# Patient Record
Sex: Male | Born: 1970 | Race: White | Hispanic: No | Marital: Married | Smoking: Never smoker
Health system: Southern US, Community
[De-identification: ages and names within clinical notes are randomized; demographics above are authoritative.]

---

## 2001-10-20 ENCOUNTER — Encounter: Payer: Self-pay | Admitting: Emergency Medicine

## 2001-10-20 ENCOUNTER — Emergency Department (HOSPITAL_COMMUNITY): Admission: EM | Admit: 2001-10-20 | Discharge: 2001-10-21 | Payer: Self-pay | Admitting: Emergency Medicine

## 2004-08-07 ENCOUNTER — Ambulatory Visit (HOSPITAL_COMMUNITY): Admission: RE | Admit: 2004-08-07 | Discharge: 2004-08-07 | Payer: Self-pay | Admitting: Orthopedic Surgery

## 2006-02-19 ENCOUNTER — Ambulatory Visit: Payer: Self-pay | Admitting: Family Medicine

## 2006-02-26 ENCOUNTER — Ambulatory Visit: Payer: Self-pay | Admitting: Family Medicine

## 2006-03-30 IMAGING — CR DG ORBITS FOR FOREIGN BODY
2 series · 2 of 2 positions shown · non-contrast
Comparison: none

CLINICAL DATA: Pre MRI.  Question metallic foreign body.
 EYE FOR FOREIGN BODY:
 No radiopaque foreign body is identified.

[view not recorded (1 of 2)]
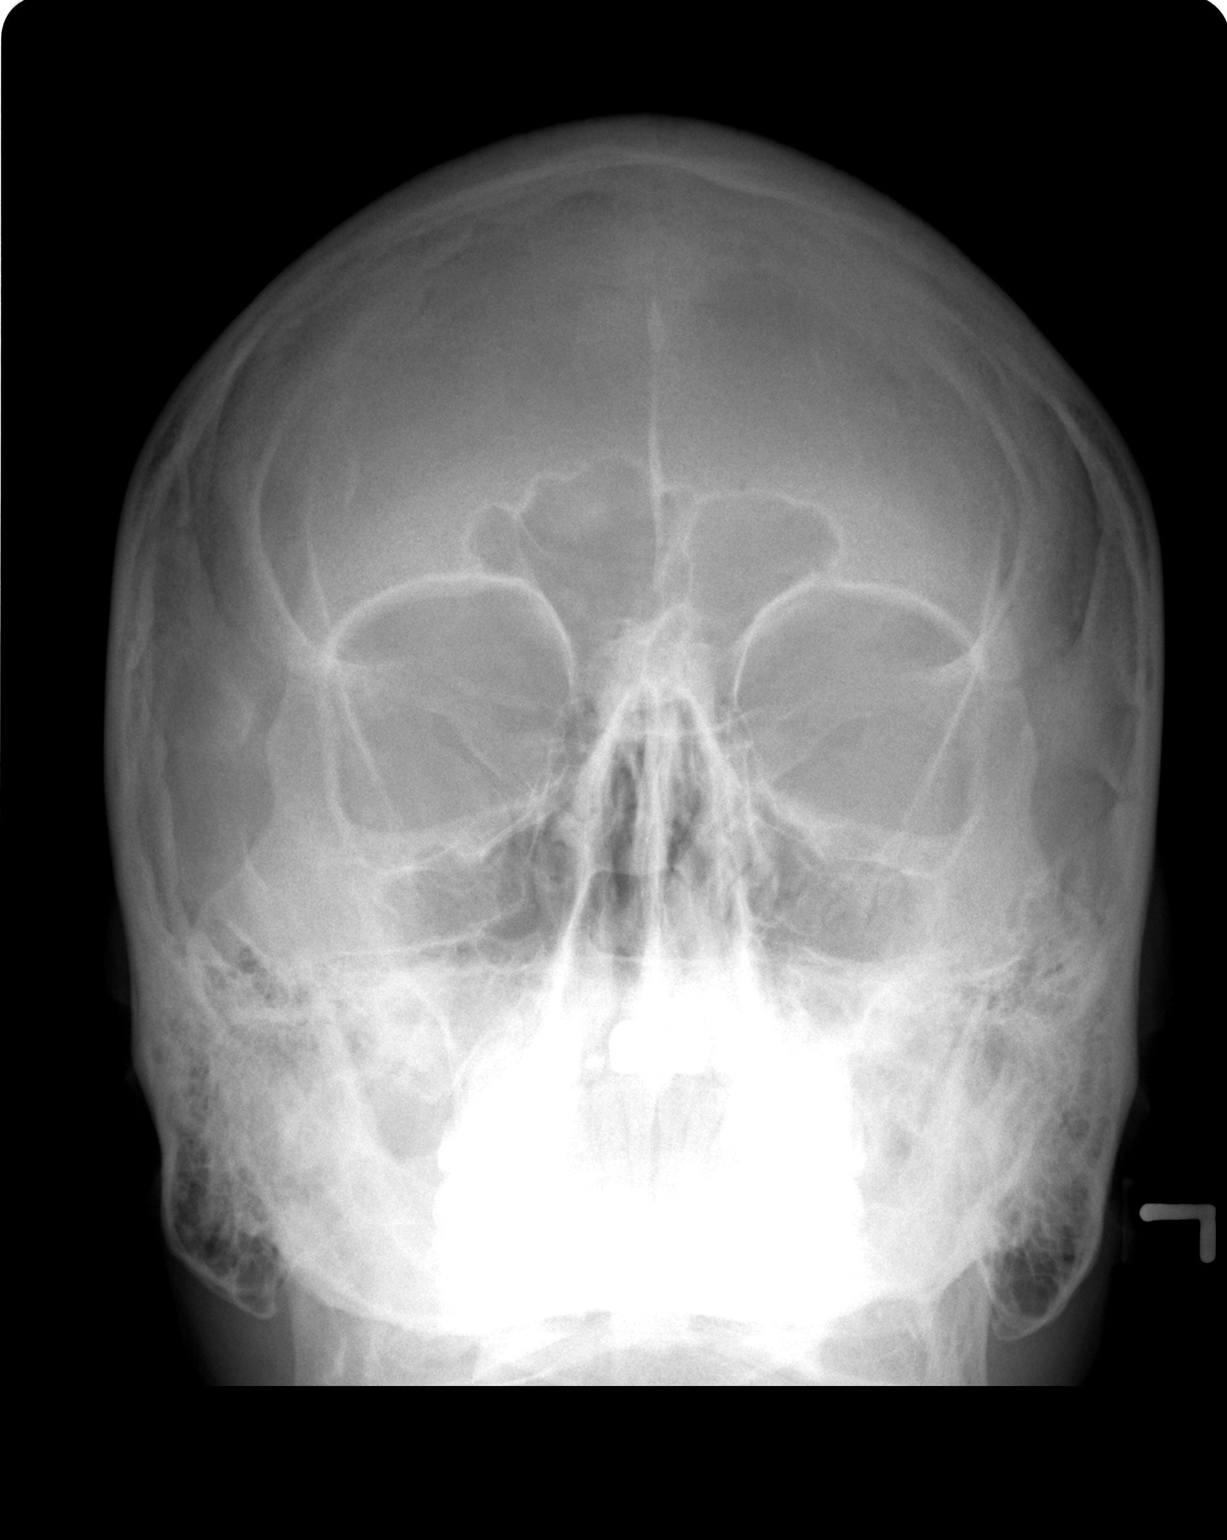

[view not recorded (2 of 2)]
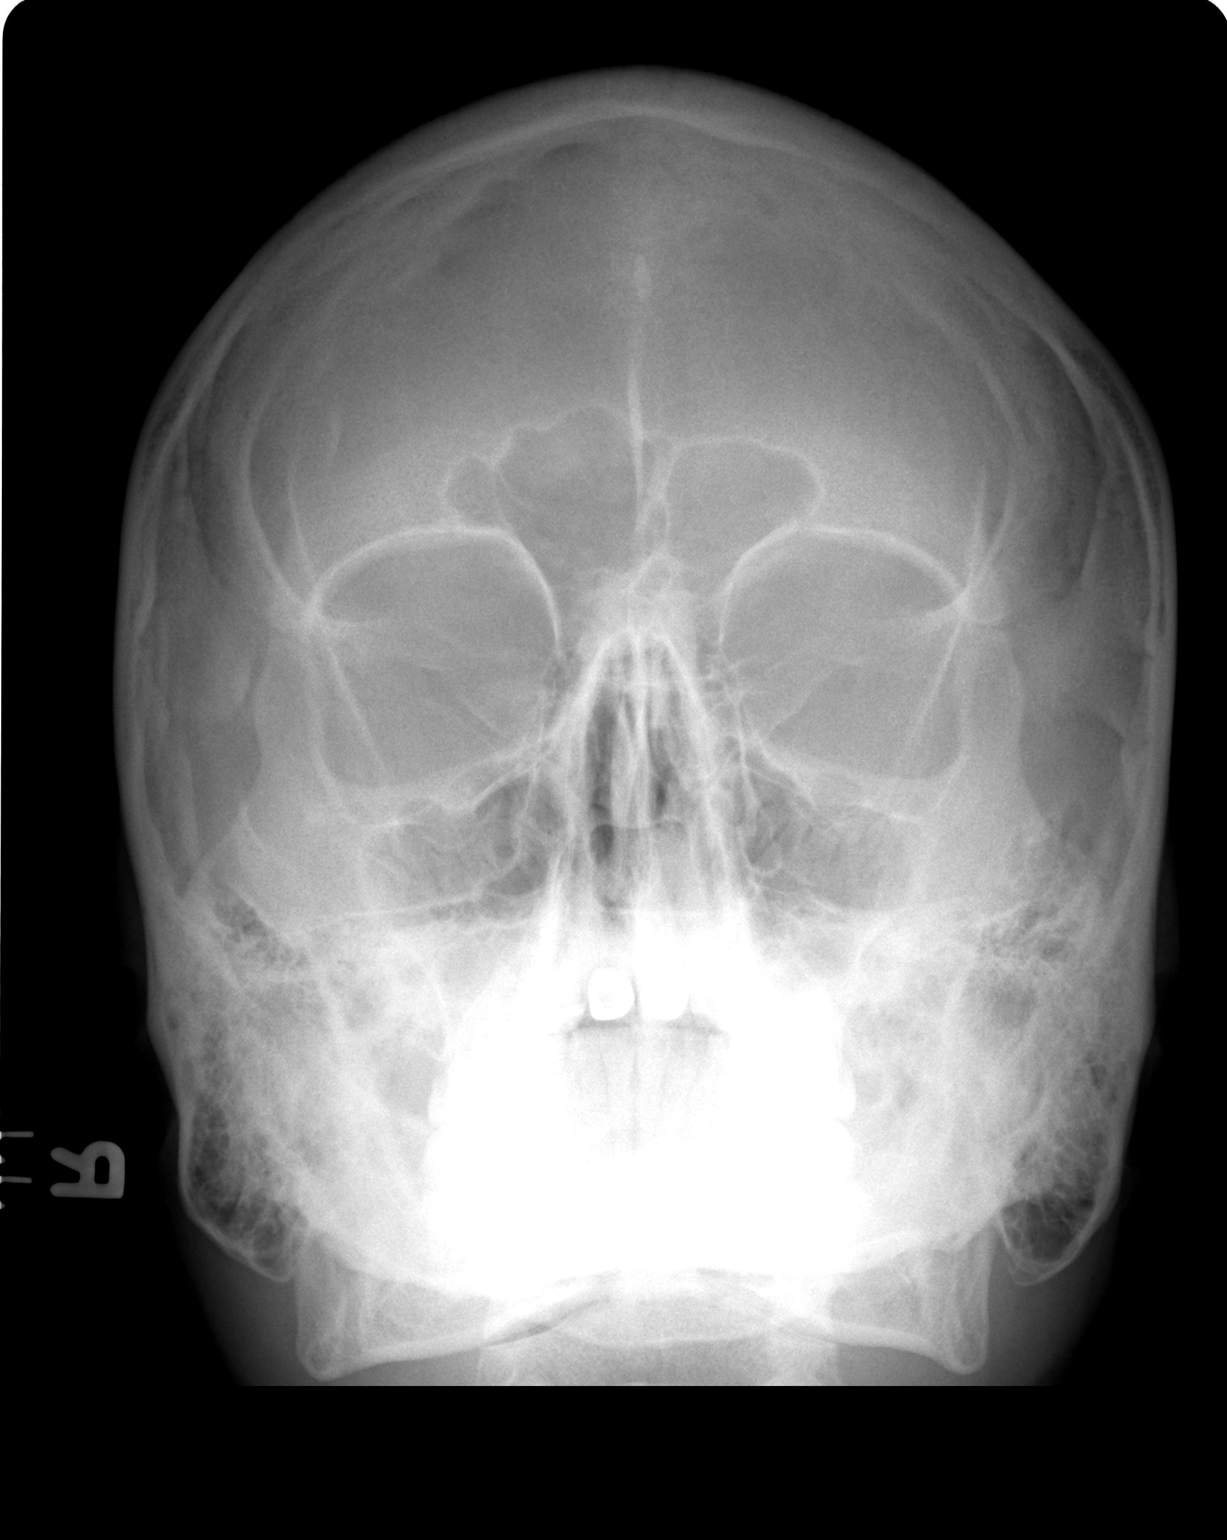

[2 of 2 positions shown; findings below may reference images not displayed]

IMPRESSION: Negative.  Patient may proceed to MRI.

## 2010-05-13 ENCOUNTER — Telehealth: Payer: Self-pay | Admitting: Family Medicine

## 2010-05-29 ENCOUNTER — Ambulatory Visit: Payer: Self-pay | Admitting: Family Medicine

## 2010-05-29 ENCOUNTER — Encounter: Payer: Self-pay | Admitting: Family Medicine

## 2010-05-29 DIAGNOSIS — M549 Dorsalgia, unspecified: Secondary | ICD-10-CM | POA: Insufficient documentation

## 2010-05-29 DIAGNOSIS — B369 Superficial mycosis, unspecified: Secondary | ICD-10-CM | POA: Insufficient documentation

## 2010-05-29 LAB — CONVERTED CEMR LAB
ALT: 38 units/L (ref 0–53)
AST: 26 units/L (ref 0–37)
Albumin: 4 g/dL (ref 3.5–5.2)
Alkaline Phosphatase: 53 units/L (ref 39–117)
BUN: 16 mg/dL (ref 6–23)
Basophils Absolute: 0 10*3/uL (ref 0.0–0.1)
Basophils Relative: 0.4 % (ref 0.0–3.0)
Bilirubin, Direct: 0.1 mg/dL (ref 0.0–0.3)
Blood in Urine, dipstick: NEGATIVE
CO2: 30 meq/L (ref 19–32)
Calcium: 9.3 mg/dL (ref 8.4–10.5)
Chloride: 102 meq/L (ref 96–112)
Cholesterol: 261 mg/dL — ABNORMAL HIGH (ref 0–200)
Creatinine, Ser: 1.2 mg/dL (ref 0.4–1.5)
Direct LDL: 180.9 mg/dL
Eosinophils Absolute: 0.1 10*3/uL (ref 0.0–0.7)
Eosinophils Relative: 1.7 % (ref 0.0–5.0)
GFR calc non Af Amer: 68.68 mL/min (ref >60)
Glucose, Bld: 86 mg/dL (ref 70–99)
Glucose, Urine, Semiquant: NEGATIVE
HCT: 47.4 % (ref 39.0–52.0)
HDL: 55.5 mg/dL (ref >39.00)
Hemoglobin: 16.2 g/dL (ref 13.0–17.0)
Ketones, urine, test strip: NEGATIVE
Lymphocytes Relative: 22.6 % (ref 12.0–46.0)
Lymphs Abs: 1.5 10*3/uL (ref 0.7–4.0)
MCHC: 34.2 g/dL (ref 30.0–36.0)
MCV: 94.5 fL (ref 78.0–100.0)
Monocytes Absolute: 0.5 10*3/uL (ref 0.1–1.0)
Monocytes Relative: 7.9 % (ref 3.0–12.0)
Neutro Abs: 4.6 10*3/uL (ref 1.4–7.7)
Neutrophils Relative %: 67.4 % (ref 43.0–77.0)
Nitrite: NEGATIVE
Platelets: 242 10*3/uL (ref 150.0–400.0)
Potassium: 4.5 meq/L (ref 3.5–5.1)
Protein, U semiquant: NEGATIVE
RBC: 5.02 M/uL (ref 4.22–5.81)
RDW: 13.6 % (ref 11.5–14.6)
Sodium: 140 meq/L (ref 135–145)
Specific Gravity, Urine: 1.025
TSH: 1.2 microintl units/mL (ref 0.35–5.50)
Total Bilirubin: 0.6 mg/dL (ref 0.3–1.2)
Total CHOL/HDL Ratio: 5
Total Protein: 6.8 g/dL (ref 6.0–8.3)
Triglycerides: 79 mg/dL (ref 0.0–149.0)
Urobilinogen, UA: 0.2
VLDL: 15.8 mg/dL (ref 0.0–40.0)
WBC Urine, dipstick: NEGATIVE
WBC: 6.8 10*3/uL (ref 4.5–10.5)
pH: 5.5

## 2010-06-18 ENCOUNTER — Telehealth: Payer: Self-pay | Admitting: *Deleted

## 2010-07-30 NOTE — Assessment & Plan Note (Signed)
Summary: CPX - OK PER DR TODD // RS   Vital Signs:  Patient profile:   40 year old male Height:      73 inches Weight:      208 pounds BMI:     27.54 Temp:     98.5 degrees F oral BP sitting:   140 / 98  (left arm) Cuff size:   regular  Vitals Entered By: Kern Reap CMA Duncan Dull) (May 29, 2010 8:05 AM)  Nutrition Counseling: Patient's BMI is greater than 25 and therefore counseled on weight management options. CC: cpx Is Patient Diabetic? No   CC:  cpx.  History of Present Illness: Mario Mcclure is a 40 year old, married man nonsmoker, who comes in today for general physical examination  Is always been in excellent health.  He said no chronic health problems.  He said numerous orthopedic injuries secondary to playing college football.  See details in previous note.  Most recently he said it back pain for the past 3+ weeks.  His worst episode was about 5 years ago, which required epidural steroid injections.  He now has pain in his right hip pain down to his right foot.  He describes as dull, a 3 on a scale of one to 10 fairly constant, however, it's getting better with stretching, and anti-inflammatories.  Is currently taking Lodine 400 b.i.d.  We have asked him to stop the prednisone, but he is also taking prednisone, one daily.  We discussed various options.  He is content to stay where he is right now to see if home therapy, etc., will not help.  He has no neurologic symptoms.  He has a rash on his right scrotum that won't heal.  Review of systems negative.  Last tetanus booster 2004.  Family history pertinent father had high cholesterol, hypertension, and a heart attack.  Preventive Screening-Counseling & Management  Alcohol-Tobacco     Smoking Status: never  Past History:  Past Medical History: concussion x 3  Past Surgical History: ACL both knees  Family History: Father: high cholesterol, HTN, MI Mother:  Siblings:   Social History: Married Never Smoked Smoking  Status:  never  Review of Systems      See HPI  Physical Exam  General:  Well-developed,well-nourished,in no acute distress; alert,appropriate and cooperative throughout examination Head:  Normocephalic and atraumatic without obvious abnormalities. No apparent alopecia or balding. Eyes:  No corneal or conjunctival inflammation noted. EOMI. Perrla. Funduscopic exam benign, without hemorrhages, exudates or papilledema. Vision grossly normal. Ears:  External ear exam shows no significant lesions or deformities.  Otoscopic examination reveals clear canals, tympanic membranes are intact bilaterally without bulging, retraction, inflammation or discharge. Hearing is grossly normal bilaterally. Nose:  External nasal examination shows no deformity or inflammation. Nasal mucosa are pink and moist without lesions or exudates. Mouth:  Oral mucosa and oropharynx without lesions or exudates.  Teeth in good repair. Neck:  No deformities, masses, or tenderness noted. Chest Wall:  No deformities, masses, tenderness or gynecomastia noted. Breasts:  No masses or gynecomastia noted Lungs:  Normal respiratory effort, chest expands symmetrically. Lungs are clear to auscultation, no crackles or wheezes. Heart:  Normal rate and regular rhythm. S1 and S2 normal without gallop, murmur, click, rub or other extra sounds. Abdomen:  Bowel sounds positive,abdomen soft and non-tender without masses, organomegaly or hernias noted. Rectal:  No external abnormalities noted. Normal sphincter tone. No rectal masses or tenderness. Genitalia:  Testes bilaterally descended without nodularity, tenderness or masses. No scrotal masses  or lesions. No penis lesions or urethral discharge................fungal infection, right scrotum            Prostate:  Prostate gland firm and smooth, no enlargement, nodularity, tenderness, mass, asymmetry or induration. Msk:  No deformity or scoliosis noted of thoracic or lumbar spine.     Pulses:  R and L carotid,radial,femoral,dorsalis pedis and posterior tibial pulses are full and equal bilaterally Extremities:  No clubbing, cyanosis, edema, or deformity noted with normal full range of motion of all joints.   Neurologic:  No cranial nerve deficits noted. Station and gait are normal. Plantar reflexes are down-going bilaterally. DTRs are symmetrical throughout. Sensory, motor and coordinative functions appear intact. Skin:  Intact without suspicious lesions or rashes Cervical Nodes:  No lymphadenopathy noted Axillary Nodes:  No palpable lymphadenopathy Inguinal Nodes:  No significant adenopathy Psych:  Cognition and judgment appear intact. Alert and cooperative with normal attention span and concentration. No apparent delusions, illusions, hallucinations   Problems:  Medical Problems Added: 1)  Dx of Routine General Medical Exam@health  Care Fac[facility]  (ICD-V70.0) 2)  Dx of Back Pain  (ICD-724.5) 3)  Dx of Fungal Dermatitis  (ICD-111.9)  Impression & Recommendations:  Problem # 1:  BACK PAIN (ICD-724.5) Assessment Deteriorated  His updated medication list for this problem includes:    Etodolac 400 Mg Tabs (Etodolac) .Marland Kitchen... Take one tab by mouth two times a day  Orders: Venipuncture (16109) Prescription Created Electronically [id number]) UA Dipstick w/o Micro (automated)  (81003) TLB-Lipid Panel (80061-LIPID) TLB-BMP (Basic Metabolic Panel-BMET) (80048-METABOL) TLB-CBC Platelet - w/Differential (85025-CBCD) TLB-Hepatic/Liver Function Pnl (80076-HEPATIC) TLB-TSH (Thyroid Stimulating Hormone) (84443-TSH)  Problem # 2:  FUNGAL DERMATITIS (ICD-111.9) Assessment: New  Orders: Venipuncture (09811) Prescription Created Electronically [phone]) UA Dipstick w/o Micro (automated)  (81003) TLB-Lipid Panel (80061-LIPID) TLB-BMP (Basic Metabolic Panel-BMET) (80048-METABOL) TLB-CBC Platelet - w/Differential (85025-CBCD) TLB-Hepatic/Liver Function Pnl (80076-HEPATIC) TLB-TSH  (Thyroid Stimulating Hormone) (84443-TSH)  His updated medication list for this problem includes:    Mentax 1 % Crea (Butenafine hcl) .Marland Kitchen... Apply two times a day  Problem # 3:  Preventive Health Care (ICD-V70.0) Assessment: Unchanged  Complete Medication List: 1)  Prednisone 20 Mg Tabs (Prednisone) .... Use as directed 2)  Etodolac 400 Mg Tabs (Etodolac) .... Take one tab by mouth two times a day 3)  Mentax 1 % Crea (Butenafine hcl) .... Apply two times a day  Other Orders: Specimen Handling (29562) EKG w/ Interpretation (93000)  Patient Instructions: 1)  Take an Aspirin every day. 2)  Taper the prednisone slowly by taking a half a tablet Monday, Wednesday, Friday, for two weeks.  Continue the Lodine, one twice a day until the pain is gone. 3)  Apply Mentax small amounts twice daily until the rash is gone. 4)  Please schedule a follow-up appointment in 1 year. Prescriptions: MENTAX 1 % CREA (BUTENAFINE HCL) apply two times a day  #30 gr x 2   Entered and Authorized by:   Roderick Pee MD   Signed by:   Roderick Pee MD on 05/29/2010   Method used:   [facility] [address] [phone][facility] (retail[facility]       [address]       [place], [place]  [address]       Ph: [phone] or [phone]       Fax: [phone]   RxID:   [id number]    Orders Added: 1)  Venipuncture [id number] 2)  Prescription Created Electronically [G8553] 3)  Est. Patient 40-64 years [99396] 4)  UA Dipstick w/o Micro (automated)  [81003] 5)  Specimen Handling [99000] 6)  EKG w/ Interpretation [93000] 7)  TLB-Lipid Panel [80061-LIPID] 8)  TLB-BMP (Basic Metabolic Panel-BMET) [80048-METABOL] 9)  TLB-CBC Platelet - w/Differential [85025-CBCD] 10)  TLB-Hepatic/Liver Function Pnl [80076-HEPATIC] 11)  TLB-TSH (Thyroid Stimulating Hormone) [34742-VZD]   Immunization History:  Tetanus/Td Immunization History:    Tetanus/Td:  historical  (06/30/2002)   Immunization History:  Tetanus/Td Immunization History:    Tetanus/Td:  Historical (06/30/2002)  Laboratory Results   Urine Tests  Date/Time Received: May 29, 2010   Routine Urinalysis   Color: yellow Appearance: Clear Glucose: negative   (Normal Range: Negative) Bilirubin: moderate   (Normal Range: Negative) Ketone: negative   (Normal Range: Negative) Spec. Gravity: 1.025   (Normal Range: 1.003-1.035) Blood: negative   (Normal Range: Negative) pH: 5.5   (Normal Range: 5.0-8.0) Protein: negative   (Normal Range: Negative) Urobilinogen: 0.2   (Normal Range: 0-1) Nitrite: negative   (Normal Range: Negative) Leukocyte Esterace: negative   (Normal Range: Negative)    Comments: Kern Reap CMA (AAMA)  May 29, 2010 1:32 PM

## 2010-07-30 NOTE — Progress Notes (Signed)
Summary: PT TO RETURN CALL - WILL NEED CPX / LABWORK SCHEDULED  Phone Note Other Incoming   Summary of Call: Nadine Counts is calling to request a refill on his prednisone for his lumbar disk disease.  His pain is been going on now for a couple weeks.  It got better with a short course of prednisone and stretching, however, it's flared up again because he went hunting over the weekend.  Advised treatment options, including physical therapy.  Patient declined PT, at this time feels to get better with medication prednisone, Flexeril, and Vicodin called, and 30 of each with one refill and directions given to him in. Initial call taken by: Roderick Pee MD,  May 13, 2010 8:32 AM  Follow-up for Phone Call        Spoke to pts wife and left msg for pt  adv him to call for cpx appt / labwork.... Pts wife stated that she will give her husband the msg.Marland KitchenMarland KitchenMarland KitchenI left my personal ext for return call.  Follow-up by: Debbra Riding,  May 13, 2010 8:57 AM  Additional Follow-up for Phone Call Additional follow up Details #1::        Pts wife called and adv that pt has to have appt close to 8am ... appt was scheduled for 05/29/2010  at  8:15am..Marland KitchenMarland KitchenPt will come in fasting. Also asked if medication has been sent to pharmacy [facility] -  [address])----Prednisone, Flexeril, and Vicodin... to help with back pain, pinched nerve.   Additional Follow-up by: Debbra Riding,  May 13, 2010 12:36 PM

## 2010-08-01 NOTE — Progress Notes (Signed)
Summary: new rx  Phone Note Outgoing Call   Call placed by: Kern Reap CMA Duncan Dull),  June 18, 2010 8:35 AM Summary of Call: new rx simvastatin 20 , return 4th week in February, with a fasting lipid and liver test. Initial call taken by: Kern Reap CMA Duncan Dull),  June 18, 2010 8:40 AM  Follow-up for Phone Call        left message on machine for patient  Follow-up by: Kern Reap CMA Duncan Dull),  June 18, 2010 9:17 AM    New/Updated Medications: SIMVASTATIN 20 MG TABS (SIMVASTATIN) take one tab by mouth at bedtime Prescriptions: SIMVASTATIN 20 MG TABS (SIMVASTATIN) take one tab by mouth at bedtime  #100 x 3   Entered by:   Kern Reap CMA (AAMA)   Authorized by:   Roderick Pee MD   Signed by:   Kern Reap CMA (AAMA) on 06/18/2010   Method used:   [facility]        [facility] [address] [phone]* (retail)       [address]       [place], [place]  [address]       Ph: [phone] or [phone]       Fax: [phone]   RxID:   [id number]

## 2010-11-15 NOTE — Assessment & Plan Note (Signed)
[facility]                              [facility] OFFICE NOTE   NAME:NALLEYEligio, Angert                        MRN:          [id number]  DATE:02/26/2006                            DOB:          [birth year]    NEW PATIENT EVALUATION   Mr. 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   PAST MEDICAL HISTORY:  He has had numerous orthopedic injuries.  He played  football the [employer] [place].  He has had both right and left ACL  repairs.  They have been scoped.  He has had cartilage damage, currently  functioning okay.  He has had a couple of broken noses.  There are  concussions x3 with no neurologic sequelae.   PAST INJURIES:  None other than the above.   ILLNESSES:  Negative.   DRUG ALLERGIES:  None.   He does not smoke but he chews tobacco.  He drinks an occasional drink of  alcohol.  No street drugs.   CURRENT MEDICATIONS:  1. Claritin p.r.n.  2. Flonase p.r.n.  3. Aspirin 81 mg q. day.   REVIEW OF SYSTEMS:  Concussion times 3 from football.  No acute or chronic  sequelae.  He wears contacts for distance vision.  He has noticed decrease  in hearing, left ear.  Used to run an [employer]; it is extremely  noisy.  His new tractor is quiet but he still notices some hearing loss.  Advised to wear earplugs.  He does get regular dental care.  CARDIOPULMONARY:  Negative.  GI:  Negative.  GU:  Negative.  ENDO:  Negative.  Weight steady.  MUSCULOSKELETAL:  Negative, except for orthopedic  injuries as noted above from playing college football.  VASCULAR:  Negative.  ALLERGIES:  Plus allergic rhinitis.  No asthma.  SKIN:  Normal.  The rest of  the review of systems negative.   SOCIAL HISTORY:  He is married.  He lives here in [place].  He runs  [employer].  Has 3 children.  He  exercises on a regular basis,  plays a lot of softball, etc.  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   FAMILY HISTORY:  Dad is 38, has had MI, PTCA, high cholesterol,  hypertension.  Mom is 65, has had an IM.  No brothers.  No sisters.  Paternal grandfather died at 42 of asbestosis.   VACCINATIONS:  His last tetanus unknown.   PHYSICAL EVALUATION:  VITAL SIGNS:  He is 191 pounds.  BP 131/81.  Pulse 70  and regular.  Temp 99.4.  Respirations 12 and regular.  He is approximately  5 feet, 10 inches tall.  GENERAL:  He is a well-developed, muscular male in no acute distress.  HEAD, EYES, EARS, NOSE, AND THROAT:  Negative.  NECK:  Supple.  Thyroid is not enlarged.  No carotid bruits.  CHEST:  Clear to auscultation.  CARDIAC:  Negative.  ABDOMINAL:  Negative.  GENITALIA:  Normal male.  EXTREMITIES:  Normal skin,  normal peripheral pulses.  No associated cords  from previous orthopedic surgery on his knees.   LABORATORY DATA:  Shows a normal CBC.  Lipid panel was total cholesterol  203, triglycerides 90, HDL was 33.  LDL was 139.  Metabolic panel was normal  as was a GFR and thyroid level, urine, and EKG.   IMPRESSION:  Healthy male.  Problem list as follows:  1. Allergic rhinitis, continue Claritin and Flonase nasal spray.  2. Tobacco use.  Advised to decrease chewing tobacco.  Given a      prescription for Chantix to use to help that process.  3. Status post concussion times 3.  No neurological sequelae.   The patient was advised to make these minor changes as noted above and see  [place] every year for physical evaluation.  His blood pressure, lipids are  normal at this juncture but he does have a family history of that, so we  need to monitor that carefully.  Also advised to continue to take the 81-mg  baby aspirin every day, as noted above.                                   Jeffrey A. Tawanna Cooler, MD   JAT/MedQ  DD:  03/05/2006  DT:  03/05/2006  Job #:  [id number]

## 2011-01-06 ENCOUNTER — Other Ambulatory Visit: Payer: Self-pay | Admitting: *Deleted

## 2011-01-06 MED ORDER — PREDNISONE 20 MG PO TABS: 20.0000 mg | ORAL_TABLET | Freq: Every day | ORAL | Status: AC

## 2012-01-15 ENCOUNTER — Other Ambulatory Visit: Payer: Self-pay | Admitting: *Deleted

## 2012-01-15 MED ORDER — KETOCONAZOLE 200 MG PO TABS: 200.0000 mg | ORAL_TABLET | Freq: Every day | ORAL | Status: AC

## 2012-01-15 MED ORDER — CLOTRIMAZOLE 1 % EX CREA: TOPICAL_CREAM | Freq: Two times a day (BID) | CUTANEOUS | Status: AC

## 2012-11-12 ENCOUNTER — Telehealth: Payer: Self-pay | Admitting: Family Medicine

## 2012-11-12 DIAGNOSIS — M549 Dorsalgia, unspecified: Secondary | ICD-10-CM

## 2012-11-12 NOTE — Telephone Encounter (Signed)
Mario Mcclure called today with severe back pain. 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 Normally it goes to his hip. He says this time the pain shot all the way down to his foot. The severe pain lasted for about 1520 minutes and seemed to dissipate except for some numbness and tingling which also went away. He started prednisone 2 tabs........ 40 mg.......... Yesterday and took some Motrin.  He states the numbness is gone but the pain is still intense an 8 on a scale of 1-10 and he can't sit.  Advise complete bed rest, prednisone 40 mg daily, Vicodin and Valium one of each 3 times a day, [facility] [address] [facility], try to resume normal activities on Monday but stay at bedrest all weekend and take his medication

## 2012-11-15 ENCOUNTER — Encounter: Payer: Self-pay | Admitting: Family Medicine

## 2012-11-15 ENCOUNTER — Ambulatory Visit (INDEPENDENT_AMBULATORY_CARE_PROVIDER_SITE_OTHER): Payer: BC Managed Care – PPO | Admitting: Family Medicine

## 2012-11-15 VITALS — BP 160/100 | Temp 98.4°F | Wt 224.0 lb

## 2012-11-15 DIAGNOSIS — M549 Dorsalgia, unspecified: Secondary | ICD-10-CM

## 2012-11-15 NOTE — Progress Notes (Signed)
  Subjective:    Patient ID: Mario Mcclure, male    DOB: 1970/10/24, 42 y.o.   MRN: [id number]  HPI Mario Mcclure is a 42 year old married male nonsmoker,,,, he played defensive back in college at [employer],,,,,,,,, who comes in today for evaluation of low back pain  Around 2 PM last Thursday he was in the warehouse he lifted something it wasn't all that much and weight about 20 pounds he twisted and developed the sudden onset of severe back pain. On a scale of 1-10 he says is a 12. Also his right leg went completely numb. The numbness dissipated in about 10 minutes however the pain persisted. The pain shot all the way down to his foot. He's had history of back pain in the past however it's never been this severe and is never gone below his hips.  I placed him at bedrest with prednisone 40 mg, Valium and Vicodin one of each 3 times daily over the last 3 days. He comes in today for followup. He says he was standing at work he didn't do any sitting around 10 PM the pain went away. He still feels it but is not as intense as it was before.  No bowel or bladder dysfunction   Review of Systems Review of systems negative except for repeated spinal trauma playing football    Objective:   Physical Exam Well-developed well-nourished male no acute distress examination of the back shows spine to be straight there is no tenderness. In the supine position his right leg was a quarter inch shorter than the left. The sensation muscle strength reflexes were normal except he does not have a right Achilles reflex. Straight leg raising was negative       Assessment & Plan:  Acute lumbar discs is a,,,,,,,,,,,, probably L5-S1 with radiation down to his foot,,,,,,,, also some secondary numbness in that right leg initially but lasted about 10 minutes and went away,,,,,,,,,, now much improved with medication and bedrest. Taper medication return when necessary

## 2012-11-15 NOTE — Patient Instructions (Signed)
Taper the prednisone as outlined  Valium and Vicodin,,,,,,,, one half to one of each at bedtime  "Baby y back"""  Return when necessary

## 2014-03-16 ENCOUNTER — Other Ambulatory Visit: Payer: Self-pay | Admitting: *Deleted

## 2014-03-16 MED ORDER — PREDNISONE 10 MG PO TABS: 10.0000 mg | ORAL_TABLET | Freq: Every day | ORAL | Status: DC

## 2019-02-16 ENCOUNTER — Other Ambulatory Visit: Payer: Self-pay

## 2019-02-16 DIAGNOSIS — Z20822 Contact with and (suspected) exposure to covid-19: Secondary | ICD-10-CM

## 2019-02-17 LAB — NOVEL CORONAVIRUS, NAA: SARS-CoV-2, NAA: NOT DETECTED

## 2019-04-18 ENCOUNTER — Other Ambulatory Visit: Payer: Self-pay

## 2019-04-18 DIAGNOSIS — Z20822 Contact with and (suspected) exposure to covid-19: Secondary | ICD-10-CM

## 2019-04-20 LAB — NOVEL CORONAVIRUS, NAA: SARS-CoV-2, NAA: DETECTED — AB

## 2021-02-06 ENCOUNTER — Other Ambulatory Visit: Payer: Self-pay

## 2021-02-06 ENCOUNTER — Encounter (HOSPITAL_BASED_OUTPATIENT_CLINIC_OR_DEPARTMENT_OTHER): Payer: Self-pay | Admitting: *Deleted

## 2021-02-06 ENCOUNTER — Emergency Department (HOSPITAL_BASED_OUTPATIENT_CLINIC_OR_DEPARTMENT_OTHER)
Admission: EM | Admit: 2021-02-06 | Discharge: 2021-02-06 | Disposition: A | Discharge status: Home / Self Care | Payer: BC Managed Care – PPO | Attending: Emergency Medicine | Admitting: Emergency Medicine

## 2021-02-06 ENCOUNTER — Emergency Department (HOSPITAL_BASED_OUTPATIENT_CLINIC_OR_DEPARTMENT_OTHER): Payer: BC Managed Care – PPO

## 2021-02-06 DIAGNOSIS — R109 Unspecified abdominal pain: Secondary | ICD-10-CM | POA: Diagnosis present

## 2021-02-06 DIAGNOSIS — N200 Calculus of kidney: Secondary | ICD-10-CM | POA: Insufficient documentation

## 2021-02-06 LAB — CBC WITH DIFFERENTIAL/PLATELET
Abs Immature Granulocytes: 0.07 10*3/uL (ref 0.00–0.07)
Basophils Absolute: 0 10*3/uL (ref 0.0–0.1)
Basophils Relative: 0 %
Eosinophils Absolute: 0 10*3/uL (ref 0.0–0.5)
Eosinophils Relative: 0 %
HCT: 47.6 % (ref 39.0–52.0)
Hemoglobin: 16.5 g/dL (ref 13.0–17.0)
Immature Granulocytes: 0 %
Lymphocytes Relative: 6 %
Lymphs Abs: 1 10*3/uL (ref 0.7–4.0)
MCH: 31 pg (ref 26.0–34.0)
MCHC: 34.7 g/dL (ref 30.0–36.0)
MCV: 89.5 fL (ref 80.0–100.0)
Monocytes Absolute: 1.6 10*3/uL — ABNORMAL HIGH (ref 0.1–1.0)
Monocytes Relative: 9 %
Neutro Abs: 14 10*3/uL — ABNORMAL HIGH (ref 1.7–7.7)
Neutrophils Relative %: 85 %
Platelets: 231 10*3/uL (ref 150–400)
RBC: 5.32 MIL/uL (ref 4.22–5.81)
RDW: 12.3 % (ref 11.5–15.5)
WBC: 16.6 10*3/uL — ABNORMAL HIGH (ref 4.0–10.5)
nRBC: 0 % (ref 0.0–0.2)

## 2021-02-06 LAB — COMPREHENSIVE METABOLIC PANEL
ALT: 37 U/L (ref 0–44)
AST: 27 U/L (ref 15–41)
Albumin: 4.2 g/dL (ref 3.5–5.0)
Alkaline Phosphatase: 60 U/L (ref 38–126)
Anion gap: 10 (ref 5–15)
BUN: 20 mg/dL (ref 6–20)
CO2: 26 mmol/L (ref 22–32)
Calcium: 8.8 mg/dL — ABNORMAL LOW (ref 8.9–10.3)
Chloride: 99 mmol/L (ref 98–111)
Creatinine, Ser: 1.76 mg/dL — ABNORMAL HIGH (ref 0.61–1.24)
GFR, Estimated: 46 mL/min — ABNORMAL LOW (ref >60)
Glucose, Bld: 102 mg/dL — ABNORMAL HIGH (ref 70–99)
Potassium: 3.7 mmol/L (ref 3.5–5.1)
Sodium: 135 mmol/L (ref 135–145)
Total Bilirubin: 1.2 mg/dL (ref 0.3–1.2)
Total Protein: 7.4 g/dL (ref 6.5–8.1)

## 2021-02-06 LAB — URINALYSIS, ROUTINE W REFLEX MICROSCOPIC
Bilirubin Urine: NEGATIVE
Glucose, UA: NEGATIVE mg/dL
Hgb urine dipstick: NEGATIVE
Ketones, ur: 15 mg/dL — AB
Leukocytes,Ua: NEGATIVE
Nitrite: NEGATIVE
Protein, ur: 30 mg/dL — AB
Specific Gravity, Urine: >1.046 — ABNORMAL HIGH (ref 1.005–1.030)
pH: 6 (ref 5.0–8.0)

## 2021-02-06 LAB — LIPASE, BLOOD: Lipase: 14 U/L (ref 11–51)

## 2021-02-06 MED ORDER — IOHEXOL 350 MG/ML SOLN
80.0000 mL | Freq: Once | INTRAVENOUS | Status: AC | PRN
  Administered 2021-02-06: 80 mL via INTRAVENOUS

## 2021-02-06 MED ORDER — SODIUM CHLORIDE 0.9 % IV BOLUS
1000.0000 mL | Freq: Once | INTRAVENOUS | Status: AC
  Administered 2021-02-06: 1000 mL via INTRAVENOUS

## 2021-02-06 MED ORDER — TAMSULOSIN HCL 0.4 MG PO CAPS: 0.4000 mg | ORAL_CAPSULE | Freq: Every day | ORAL | 0 refills | Status: AC

## 2021-02-06 MED ORDER — ONDANSETRON HCL 4 MG/2ML IJ SOLN
4.0000 mg | Freq: Once | INTRAMUSCULAR | Status: AC
  Administered 2021-02-06: 4 mg via INTRAVENOUS
  Filled 2021-02-06: qty 2

## 2021-02-06 MED ORDER — FENTANYL CITRATE (PF) 100 MCG/2ML IJ SOLN
50.0000 ug | Freq: Once | INTRAMUSCULAR | Status: AC
Start: 2021-02-06 — End: 2021-02-06
  Administered 2021-02-06: 50 ug via INTRAVENOUS
  Filled 2021-02-06: qty 2

## 2021-02-06 MED ORDER — FENTANYL CITRATE (PF) 100 MCG/2ML IJ SOLN
50.0000 ug | Freq: Once | INTRAMUSCULAR | Status: AC
Start: 2021-02-06 — End: 2021-02-06

## 2021-02-06 MED ORDER — HYDROMORPHONE HCL 1 MG/ML IJ SOLN
1.0000 mg | Freq: Once | INTRAMUSCULAR | Status: AC
  Administered 2021-02-06: 1 mg via INTRAVENOUS
  Filled 2021-02-06: qty 1

## 2021-02-06 MED ORDER — OXYCODONE-ACETAMINOPHEN 5-325 MG PO TABS: 1.0000 | ORAL_TABLET | Freq: Four times a day (QID) | ORAL | 0 refills | Status: AC | PRN

## 2021-02-06 MED ORDER — ONDANSETRON 4 MG PO TBDP: 4.0000 mg | ORAL_TABLET | Freq: Three times a day (TID) | ORAL | 0 refills | Status: AC | PRN

## 2021-02-06 MED ORDER — FENTANYL CITRATE (PF) 100 MCG/2ML IJ SOLN
INTRAMUSCULAR | Status: AC
  Administered 2021-02-06: 50 ug via INTRAVENOUS
  Filled 2021-02-06: qty 2

## 2021-02-06 NOTE — ED Provider Notes (Signed)
[facility] EMERGENCY DEPT Provider Note   CSN: [id number] Arrival date & time: 02/06/21  0920     History Chief Complaint  Patient presents with   Flank Pain    Mario Mcclure is a 50 y.o. male.  Presents to ER with concern for side pain.  Patient reports that pain has been ongoing since Monday early morning.  Seems to be on the left side, radiates to left abdomen and left groin.  Comes and goes.  Sharp.  Tried Motrin with some improvement.  No fevers.  Some nausea.  No prior history of diverticulitis or kidney stones.  No pain or swelling or redness in the testicle at present.  HPI     History reviewed. No pertinent past medical history.  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 No pertinent surgical history.     History reviewed. No pertinent family history.  Social History   Tobacco Use   Smoking status: Never   Smokeless tobacco: Never  Vaping Use   Vaping Use: Never used  Substance Use Topics   Alcohol use: Yes    Comment: every night   Drug use: Never    Home Medications Prior to Admission medications   Medication Sig Start Date End Date Taking? Authorizing Provider  ondansetron (ZOFRAN ODT) 4 MG disintegrating tablet Take 1 tablet (4 mg total) by mouth every 8 (eight) hours as needed for nausea or vomiting. 02/06/21  Yes Jaice Digioia, Quitman Livings, MD  oxyCODONE-acetaminophen (PERCOCET/ROXICET) 5-325 MG tablet Take 1 tablet by mouth every 6 (six) hours as needed for severe pain. 02/06/21  Yes Milagros Loll, MD  tamsulosin (FLOMAX) 0.4 MG CAPS capsule Take 1 capsule (0.4 mg total) by mouth daily. 02/06/21  Yes Milagros Loll, MD    Allergies    Patient has no known allergies.  Review of Systems   Review of Systems  Constitutional:  Positive for fatigue. Negative for chills and fever.  HENT:  Negative for ear pain and sore throat.   Eyes:  Negative for  pain and visual disturbance.  Respiratory:  Negative for cough and shortness of breath.   Cardiovascular:  Negative for chest pain and palpitations.  Gastrointestinal:  Positive for abdominal pain and nausea. Negative for vomiting.  Genitourinary:  Negative for dysuria and hematuria.  Musculoskeletal:  Negative for arthralgias and back pain.  Skin:  Negative for color change and rash.  Neurological:  Negative for seizures and syncope.  All other systems reviewed and are negative.  Physical Exam Updated Vital Signs BP (!) 186/121 (BP Location: Right Arm)   Pulse 95   Temp 98.6 F (37 C) (Oral)   Resp 18   Ht 6\' 2"  (1.88 m)   Wt 104.3 kg   SpO2 95%   BMI 29.53 kg/m   Physical Exam Vitals and nursing note reviewed.  Constitutional:      Appearance: He is well-developed.  HENT:     Head: Normocephalic and atraumatic.  Eyes:     Conjunctiva/sclera: Conjunctivae normal.  Cardiovascular:     Rate and Rhythm: Normal rate and regular rhythm.     Heart sounds: No murmur heard. Pulmonary:     Effort: Pulmonary effort is normal. No respiratory distress.     Breath sounds: Normal breath sounds.  Abdominal:     Palpations: Abdomen is soft.     Tenderness: There is abdominal tenderness.  Comments: Some TTP in LLQ, L flank, no rebound or guarding.   Musculoskeletal:     Cervical back: Neck supple.  Skin:    General: Skin is warm and dry.  Neurological:     General: No focal deficit present.     Mental Status: He is alert.  Psychiatric:        Mood and Affect: Mood normal.    ED Results / Procedures / Treatments   Labs (all labs ordered are listed, but only abnormal results are displayed) Labs Reviewed  CBC WITH DIFFERENTIAL/PLATELET - Abnormal; Notable for the following components:      Result Value   WBC 16.6 (*)    Neutro Abs 14.0 (*)    Monocytes Absolute 1.6 (*)    All other components within normal limits  COMPREHENSIVE METABOLIC PANEL - Abnormal; Notable for the  following components:   Glucose, Bld 102 (*)    Creatinine, Ser 1.76 (*)    Calcium 8.8 (*)    GFR, Estimated 46 (*)    All other components within normal limits  URINALYSIS, ROUTINE W REFLEX MICROSCOPIC - Abnormal; Notable for the following components:   Specific Gravity, Urine >1.046 (*)    Ketones, ur 15 (*)    Protein, ur 30 (*)    All other components within normal limits  LIPASE, BLOOD    EKG None  Radiology CT ABDOMEN PELVIS W CONTRAST  Result Date: 02/06/2021 CLINICAL DATA:  Diverticulitis suspected, left lower quadrant pain EXAM: CT ABDOMEN AND PELVIS WITH CONTRAST TECHNIQUE: Multidetector CT imaging of the abdomen and pelvis was performed using the standard protocol following bolus administration of intravenous contrast. CONTRAST:  87mL OMNIPAQUE IOHEXOL 350 MG/ML SOLN COMPARISON:  None. FINDINGS: Lower chest: There is minimal dependent subsegmental atelectasis in the lung bases. The imaged heart is unremarkable. Hepatobiliary: The liver and gallbladder are normal. There is no biliary ductal dilatation. Pancreas: Normal. Spleen: Normal. Adrenals/Urinary Tract: The adrenals are unremarkable. There is moderate left hydroureteronephrosis with periureteral stranding. There is a punctate calculus at the left UVJ. There is thickening of the bladder wall in the region of the UVJ measuring up to 8 mm. The left kidney is relatively hypoenhancing compared to the right. No contrast is seen within the left collecting system on the delayed images. An additional punctate nonobstructing left upper pole stone is seen. A few hypodense lesions throughout the left kidney likely reflect cysts. The right kidney is unremarkable. There is no right hydronephrosis or hydroureter. Stomach/Bowel: The stomach is unremarkable. There is no evidence of bowel obstruction. There is no abnormal bowel wall thickening or inflammatory change. There are a few colonic diverticuli without evidence of acute diverticulitis. The  appendix is normal. Vascular/Lymphatic: The abdominal aorta is nonaneurysmal. The main portal and splenic veins are patent. There is no abdominal or pelvic lymphadenopathy. Reproductive: The prostate and seminal vesicles are unremarkable. Other: There is no ascites or free air. Musculoskeletal: There is mild multilevel degenerative change of the lumbar spine with grade 1 anterolisthesis of L5 on S1. There is no acute osseous abnormality. IMPRESSION: Punctate stone at the left UVJ with moderate upstream hydroureteronephrosis and periureteral inflammation. Focal thickening of the bladder wall in the region of the left UVJ raises suspicion for an underlying lesion contributing to the obstruction. Recommend urology consult and correlation with cystoscopy. Electronically Signed   By: Lesia Hausen MD   On: 02/06/2021 11:07    Procedures Procedures   Medications Ordered in ED Medications  ondansetron [facility])  injection 4 mg (4 mg Intravenous Given 02/06/21 1002)  fentaNYL (SUBLIMAZE) injection 50 mcg (50 mcg Intravenous Given 02/06/21 1002)  sodium chloride 0.9 % bolus 1,000 mL (0 mLs Intravenous Stopped 02/06/21 1122)  iohexol (OMNIPAQUE) 350 MG/ML injection 80 mL (80 mLs Intravenous Contrast Given 02/06/21 1029)  fentaNYL (SUBLIMAZE) injection 50 mcg (50 mcg Intravenous Given 02/06/21 1056)  HYDROmorphone (DILAUDID) injection 1 mg (1 mg Intravenous Given 02/06/21 1157)    ED Course  I have reviewed the triage vital signs and the nursing notes.  Pertinent labs & imaging results that were available during my care of the patient were reviewed by me and considered in my medical decision making (see chart for details).    MDM Rules/Calculators/A&P                           50 year old male presents to ER with concern for left flank pain.  On exam patient somewhat uncomfortable secondary to pain but not in distress.  No fever.  CT scan concerning for punctate UVJ stone.  Radiologist stated there was some  inflammation in bladder and cannot rule out underlying lesion.  Lab work noted for mild AKI.  Leukocytosis.  UA negative for infection.  Pain is currently well controlled.  Discussed with Dr. McDiarmid.  Recommend outpatient follow-up in their clinic, they will discuss the CT results and need for any potential further studies at that time.  Discussed return precautions, discussed need for urology follow-up, potential further monitoring of that area noted by radiologist and repeat labs and discharged home.    After the discussed management above, the patient was determined to be safe for discharge.  The patient was in agreement with this plan and all questions regarding their care were answered.  ED return precautions were discussed and the patient will return to the ED with any significant worsening of condition.  Final Clinical Impression(s) / ED Diagnoses Final diagnoses:  Nephrolithiasis    Rx / DC Orders ED Discharge Orders          Ordered    tamsulosin (FLOMAX) 0.4 MG CAPS capsule  Daily        02/06/21 1225    oxyCODONE-acetaminophen (PERCOCET/ROXICET) 5-325 MG tablet  Every 6 hours PRN        02/06/21 1225    ondansetron (ZOFRAN ODT) 4 MG disintegrating tablet  Every 8 hours PRN        02/06/21 1225             Milagros Loll, MD 02/06/21 1231

## 2021-02-06 NOTE — ED Triage Notes (Addendum)
Patient was at [facility] this morning but can not diagnosed for kidney stone. Was advised to come here for CT scan  Per pt, he went to the bathroom Monday early morning and had a BM but not a good one.  Went back again for another BM and the pain to his left flank pain radiating to his left upper quadrant started.  Patient felt that he is constipated and feeling bloated.

## 2021-02-06 NOTE — ED Notes (Signed)
ED Provider at bedside. 

## 2021-02-06 NOTE — Discharge Instructions (Addendum)
Please contact the urology office to get follow-up appointment.  Your creatinine (kidney function) was mildly worse today.  You should have this rechecked sometime in the next few days to 1 week.  Recommend taking Tylenol as needed for pain control.  For breakthrough pain can take the prescribed Percocet.  Due to the change in your kidney function, I would recommend staying away from anti-inflammatories such as naproxen or Motrin.  If you develop fever, uncontrolled pain, vomiting, come back to ER for reassessment.

## 2021-03-12 ENCOUNTER — Encounter (HOSPITAL_COMMUNITY): Payer: Self-pay | Admitting: Radiology

## 2022-09-29 IMAGING — CT CT ABD-PELV W/ CM
2 of 5 series · 15 of 46 positions shown, 17 images · IV contrast (APPLIED)
Comparison: None.

CLINICAL DATA: Diverticulitis suspected, left lower quadrant pain

EXAM:
CT ABDOMEN AND PELVIS WITH CONTRAST
TECHNIQUE: Multidetector CT imaging of the abdomen and pelvis was performed
using the standard protocol following bolus administration of
intravenous contrast.
CONTRAST:  80mL OMNIPAQUE IOHEXOL 350 MG/ML SOLN

[Series 2: abd pel w · axial · 0.78mm/px · z∈[+778,+1232]mm · 12 of 103 slices shown, 14 images]
[im 6/103  soft-tissue]
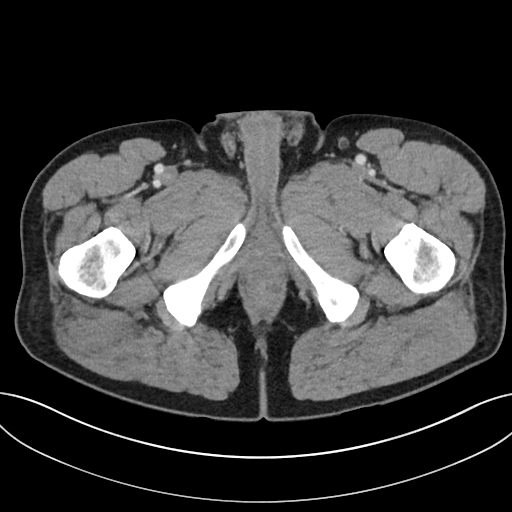
[im 6/103  bone]
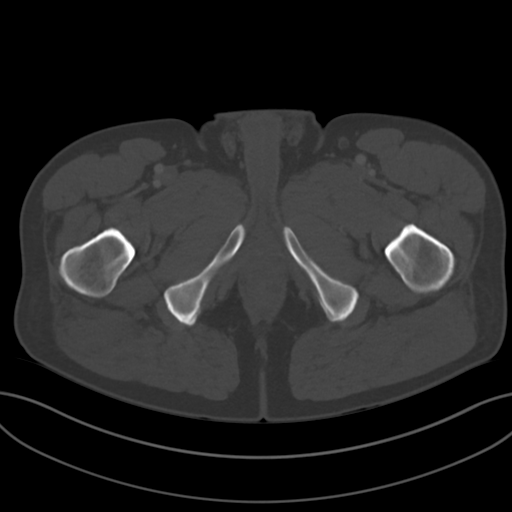
[im 17/103  soft-tissue]
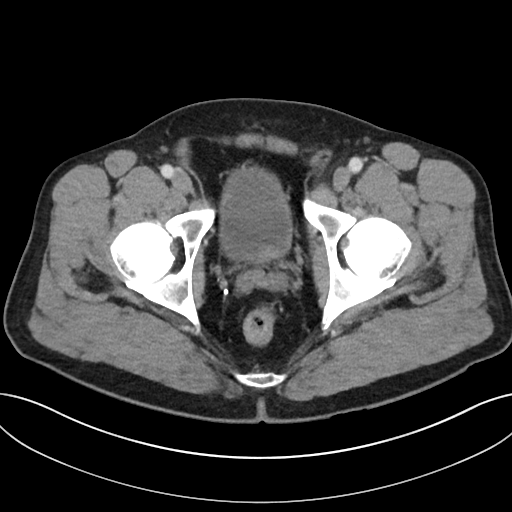
[im 22/103  soft-tissue]
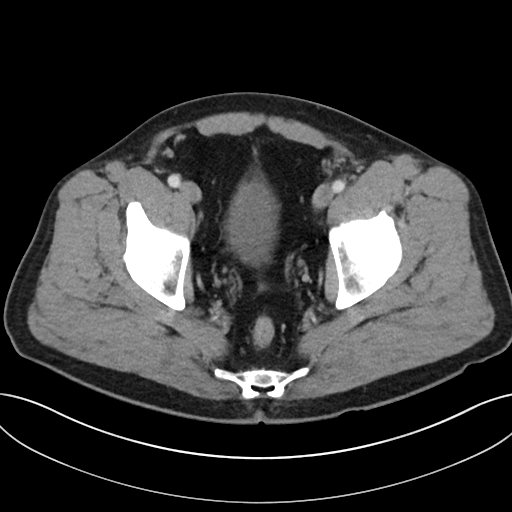
[im 33/103  soft-tissue]
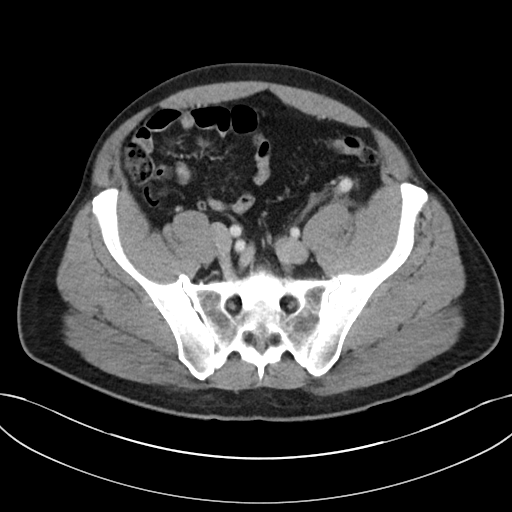
[im 38/103  soft-tissue]
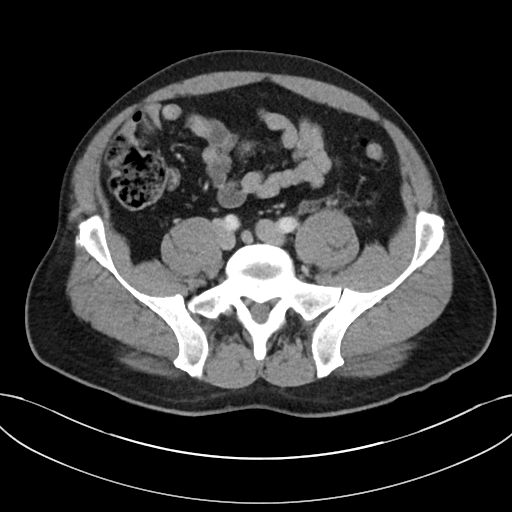
[im 49/103  soft-tissue]
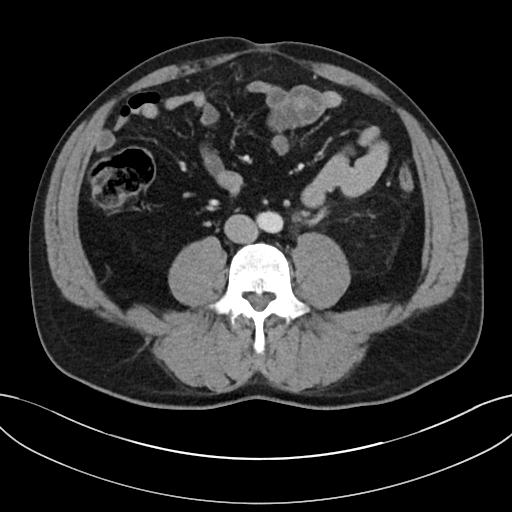
[im 54/103  soft-tissue]
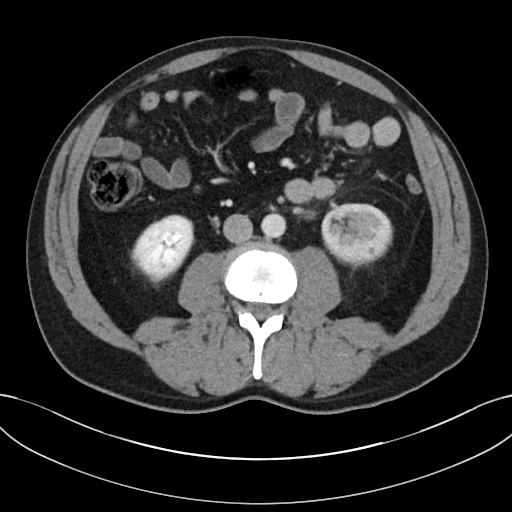
[im 65/103  soft-tissue]
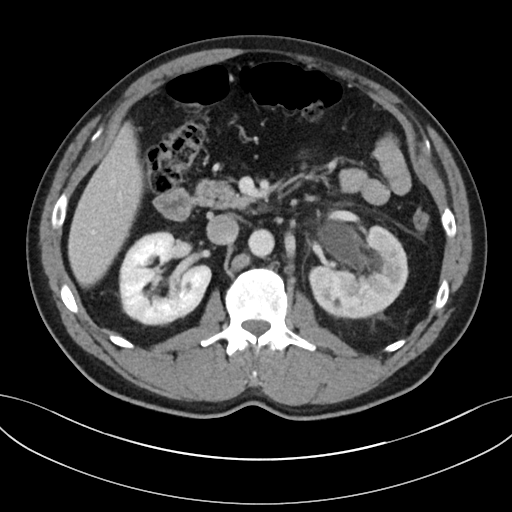
[im 70/103  soft-tissue]
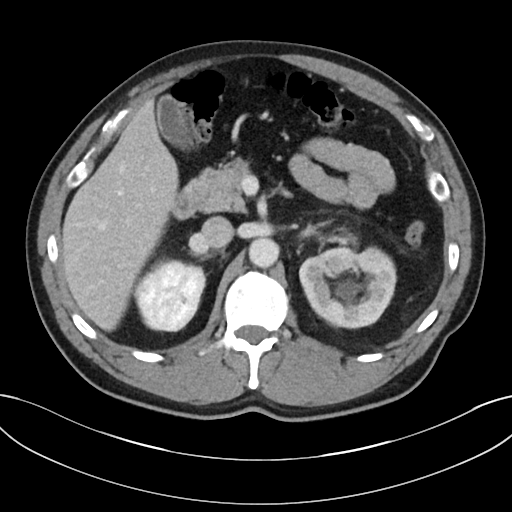
[im 70/103  bone]
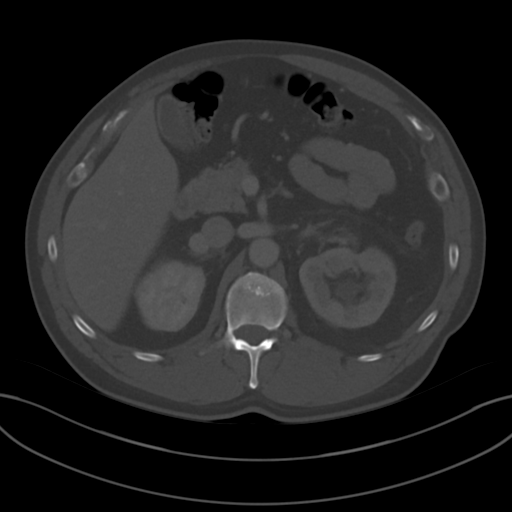
[im 81/103  soft-tissue]
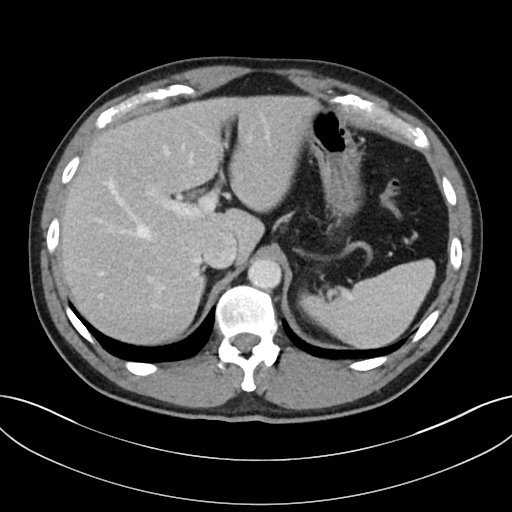
[im 86/103  soft-tissue]
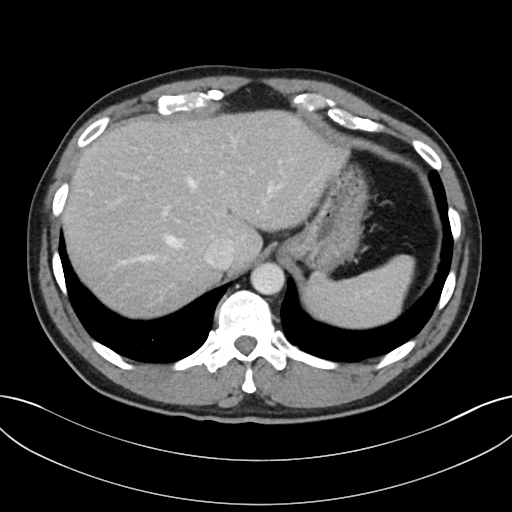
[im 97/103  soft-tissue]
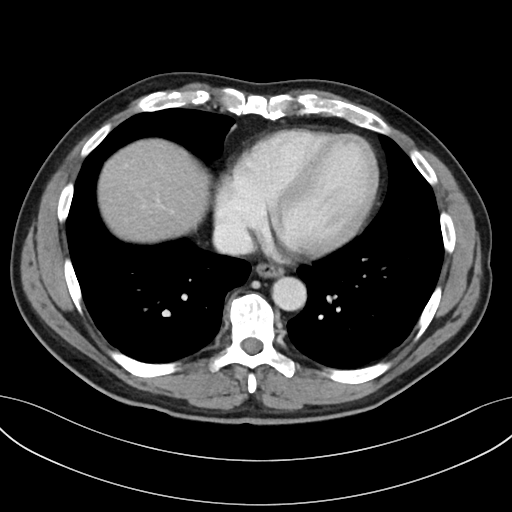

[Series 5: coronal · coronal · 0.79mm/px · 3 of 108 slices shown]
[im 36/108  soft-tissue]
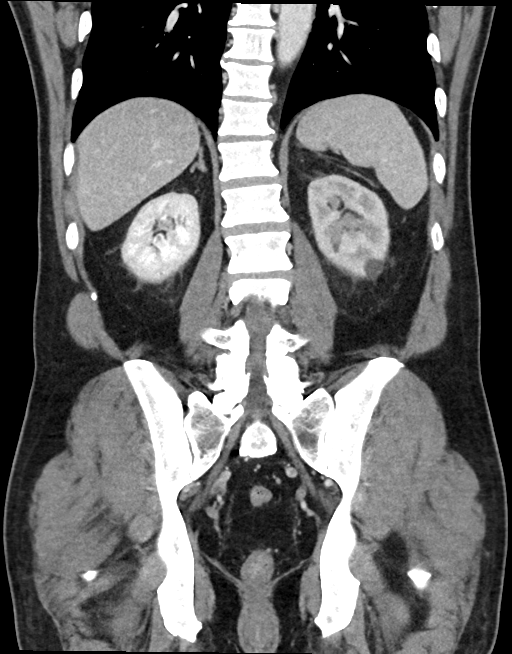
[im 48/108  soft-tissue]
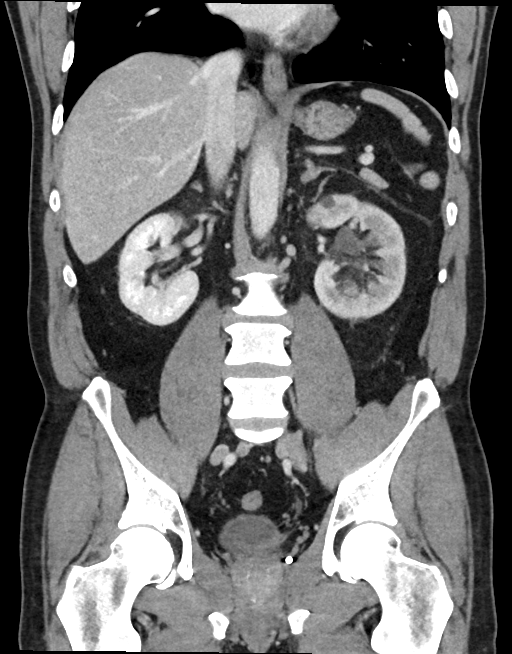
[im 60/108  soft-tissue]
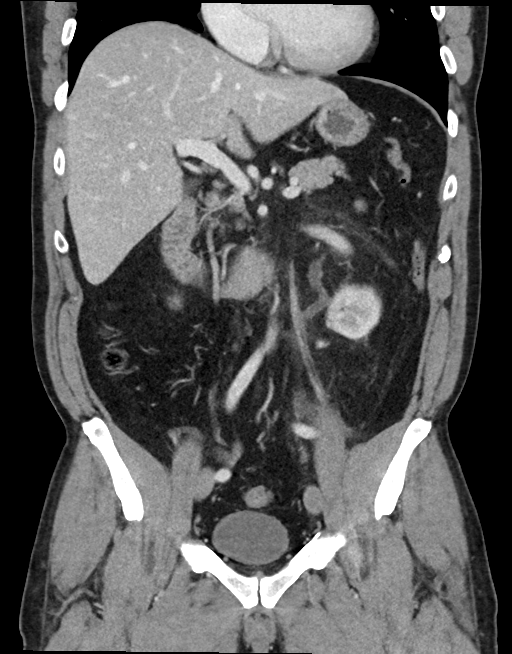

[15 of 46 positions shown; findings below may reference images not displayed]

FINDINGS: Lower chest: There is minimal dependent subsegmental atelectasis in
the lung bases. The imaged heart is unremarkable.

Hepatobiliary: The liver and gallbladder are normal. There is no
biliary ductal dilatation.

Pancreas: Normal.

Spleen: Normal.

Adrenals/Urinary Tract: The adrenals are unremarkable.

There is moderate left hydroureteronephrosis with periureteral
stranding. There is a punctate calculus at the left UVJ. There is
thickening of the bladder wall in the region of the UVJ measuring up
to 8 mm. The left kidney is relatively hypoenhancing compared to the
right. No contrast is seen within the left collecting system on the
delayed images. An additional punctate nonobstructing left upper
pole stone is seen.

A few hypodense lesions throughout the left kidney likely reflect
cysts. The right kidney is unremarkable. There is no right
hydronephrosis or hydroureter.

Stomach/Bowel: The stomach is unremarkable. There is no evidence of
bowel obstruction. There is no abnormal bowel wall thickening or
inflammatory change. There are a few colonic diverticuli without
evidence of acute diverticulitis. The appendix is normal.

Vascular/Lymphatic: The abdominal aorta is nonaneurysmal. The main
portal and splenic veins are patent. There is no abdominal or pelvic
lymphadenopathy.

Reproductive: The prostate and seminal vesicles are unremarkable.

Other: There is no ascites or free air.

Musculoskeletal: There is mild multilevel degenerative change of the
lumbar spine with grade 1 anterolisthesis of L5 on S1. There is no
acute osseous abnormality.
IMPRESSION: Punctate stone at the left UVJ with moderate upstream
hydroureteronephrosis and periureteral inflammation.

Focal thickening of the bladder wall in the region of the left UVJ
raises suspicion for an underlying lesion contributing to the
obstruction. Recommend urology consult and correlation with
cystoscopy.
